# Patient Record
Sex: Female | Born: 1965 | Race: White | Hispanic: No
Health system: Southern US, Community
[De-identification: ages and names within clinical notes are randomized; demographics above are authoritative.]

---

## 2005-01-15 ENCOUNTER — Ambulatory Visit: Payer: Self-pay | Admitting: Pain Medicine

## 2013-06-30 ENCOUNTER — Other Ambulatory Visit: Payer: Self-pay | Admitting: Rehabilitation

## 2013-06-30 DIAGNOSIS — M545 Low back pain, unspecified: Secondary | ICD-10-CM

## 2013-07-21 ENCOUNTER — Inpatient Hospital Stay: Admission: RE | Admit: 2013-07-21 | Payer: Self-pay | Source: Ambulatory Visit

## 2013-12-02 ENCOUNTER — Other Ambulatory Visit: Payer: Self-pay | Admitting: Orthopaedic Surgery

## 2013-12-02 DIAGNOSIS — M545 Low back pain, unspecified: Secondary | ICD-10-CM

## 2013-12-14 ENCOUNTER — Ambulatory Visit
Admission: RE | Admit: 2013-12-14 | Discharge: 2013-12-14 | Disposition: A | Discharge status: Home / Self Care | Payer: Medicare Other | Source: Ambulatory Visit | Attending: Orthopaedic Surgery | Admitting: Orthopaedic Surgery

## 2013-12-14 DIAGNOSIS — M545 Low back pain, unspecified: Secondary | ICD-10-CM

## 2016-06-25 ENCOUNTER — Other Ambulatory Visit: Payer: Self-pay | Admitting: Pain Medicine

## 2016-06-29 ENCOUNTER — Other Ambulatory Visit: Payer: Self-pay | Admitting: Pain Medicine

## 2018-02-08 ENCOUNTER — Other Ambulatory Visit: Payer: Self-pay | Admitting: Family Medicine

## 2018-02-08 ENCOUNTER — Ambulatory Visit
Admission: RE | Admit: 2018-02-08 | Discharge: 2018-02-08 | Disposition: A | Discharge status: Home / Self Care | Payer: Medicare Other | Source: Ambulatory Visit | Attending: Family Medicine | Admitting: Family Medicine

## 2018-02-08 DIAGNOSIS — R0602 Shortness of breath: Secondary | ICD-10-CM

## 2018-02-08 DIAGNOSIS — S7011XA Contusion of right thigh, initial encounter: Secondary | ICD-10-CM

## 2018-02-08 DIAGNOSIS — M79662 Pain in left lower leg: Secondary | ICD-10-CM | POA: Insufficient documentation

## 2018-06-20 ENCOUNTER — Other Ambulatory Visit: Payer: Self-pay | Admitting: Physician Assistant
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
## 2020-01-13 ENCOUNTER — Other Ambulatory Visit: Payer: Self-pay | Admitting: Surgery

## 2020-01-13 DIAGNOSIS — M25562 Pain in left knee: Secondary | ICD-10-CM

## 2020-02-14 ENCOUNTER — Other Ambulatory Visit: Payer: Self-pay

## 2020-02-14 ENCOUNTER — Ambulatory Visit
Admission: RE | Admit: 2020-02-14 | Discharge: 2020-02-14 | Disposition: A | Discharge status: Home / Self Care | Payer: Medicare Other | Source: Ambulatory Visit | Attending: Surgery | Admitting: Surgery

## 2020-02-14 DIAGNOSIS — M25562 Pain in left knee: Secondary | ICD-10-CM

## 2021-08-06 IMAGING — MR MR KNEE*L* W/O CM
6 series · 40 of 40 positions shown · non-contrast
Comparison: Plain films of the knees 10/25/2012

CLINICAL DATA: History of prior left knee surgery x3, most recently
10 years ago. Posterior and lateral left knee pain and swelling.

EXAM:
MRI OF THE LEFT KNEE WITHOUT CONTRAST
TECHNIQUE: Multiplanar, multisequence MR imaging of the knee was performed. No
intravenous contrast was administered.

[Series 15: T2 fat-sat · axial · left · 4.0mm · 0.50mm/px · z∈[-95,+29]mm · 5 of 26 slices shown (1 of 3)]
[im 1/26]
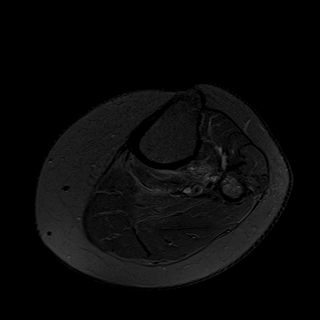
[im 7/26]
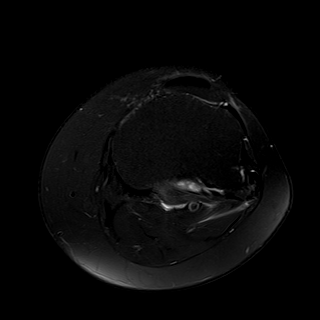
[im 13/26]
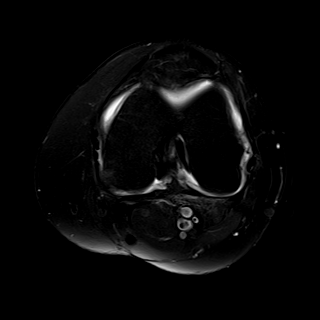
[im 19/26]
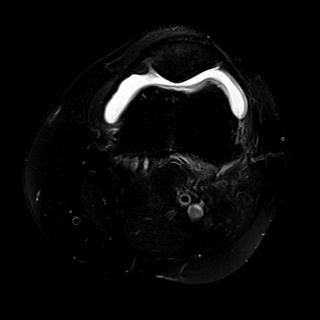
[im 26/26]
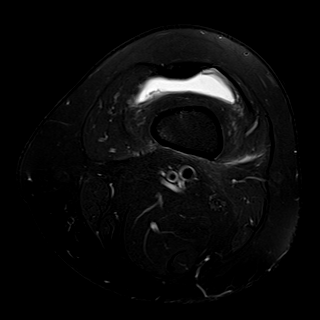

[Series 16: T1 · coronal · left · 4.0mm · 0.39mm/px · 7 of 32 slices shown]
[im 1/32]
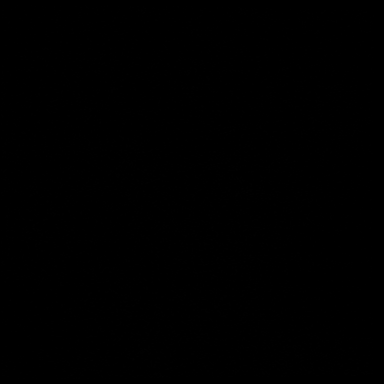
[im 6/32]
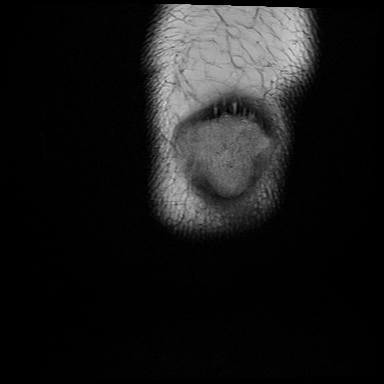
[im 11/32]
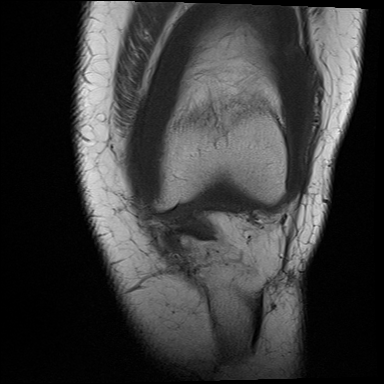
[im 16/32]
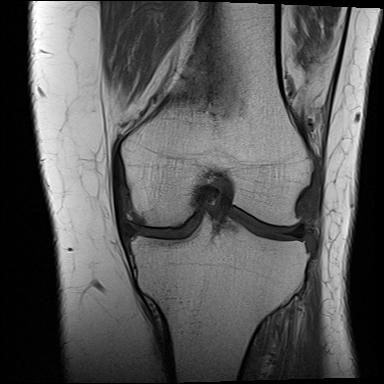
[im 21/32]
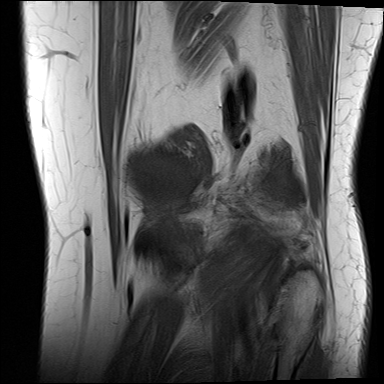
[im 26/32]
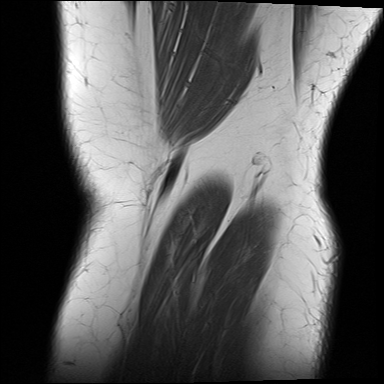
[im 32/32]
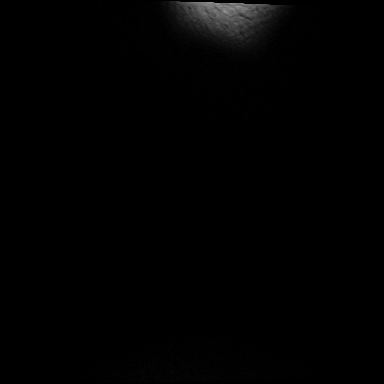

[Series 17: T2 fat-sat · coronal · left · 4.0mm · 0.59mm/px · 7 of 32 slices shown (2 of 3)]
[im 1/32]
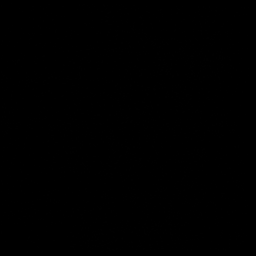
[im 6/32]
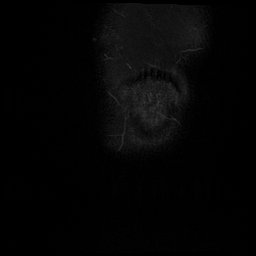
[im 11/32]
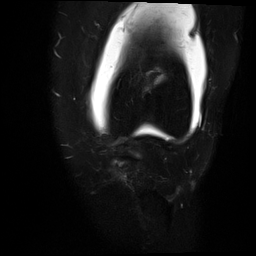
[im 16/32]
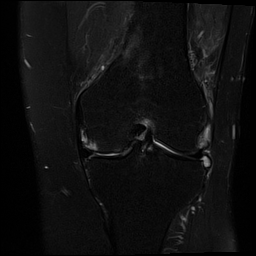
[im 21/32]
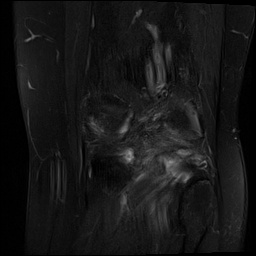
[im 26/32]
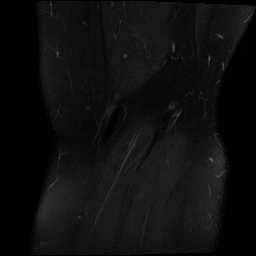
[im 32/32]
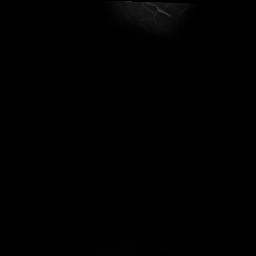

[Series 18: PD fat-sat · coronal · left · 4.0mm · 0.59mm/px · 7 of 32 slices shown (1 of 2)]
[im 1/32]
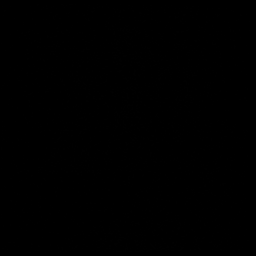
[im 6/32]
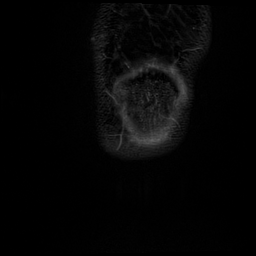
[im 11/32]
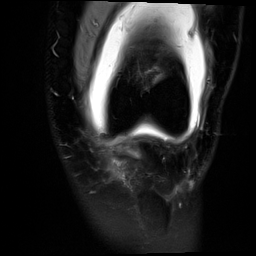
[im 16/32]
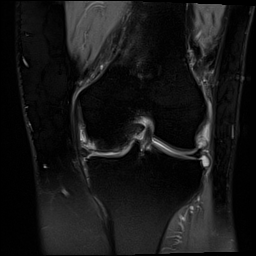
[im 21/32]
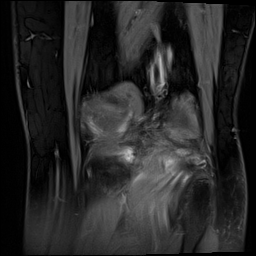
[im 26/32]
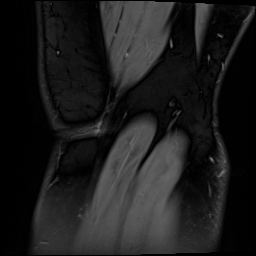
[im 32/32]
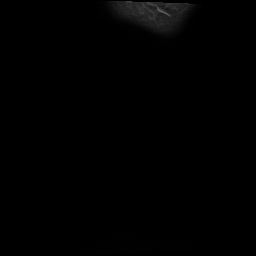

[Series 19: PD fat-sat · sagittal · left · 3.0mm · 0.59mm/px · 7 of 35 slices shown (2 of 2)]
[im 1/35]
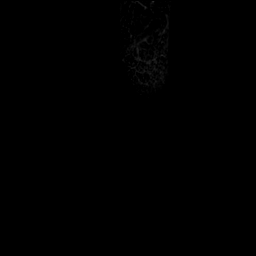
[im 6/35]
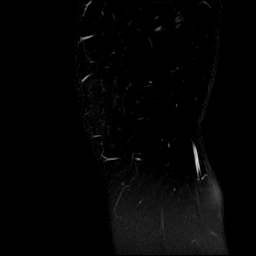
[im 12/35]
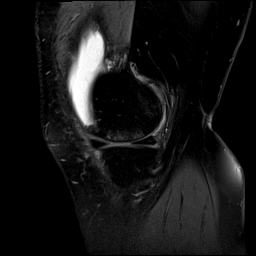
[im 18/35]
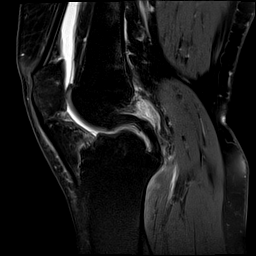
[im 23/35]
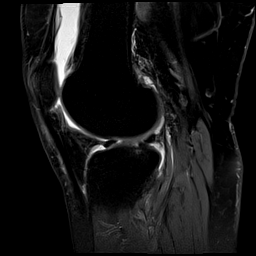
[im 29/35]
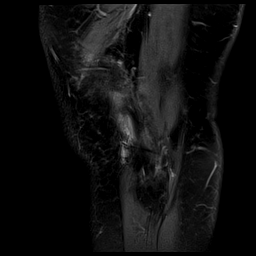
[im 35/35]
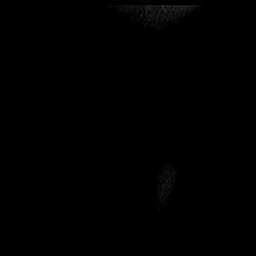

[Series 20: T2 fat-sat · sagittal · left · 3.0mm · 0.59mm/px · 7 of 36 slices shown (3 of 3)]
[im 1/36]
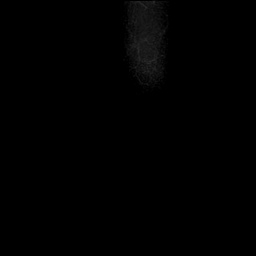
[im 6/36]
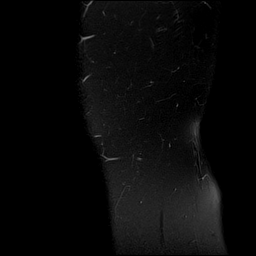
[im 12/36]
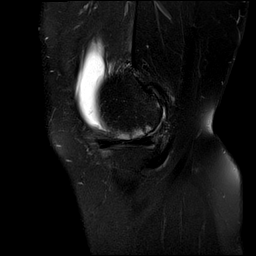
[im 18/36]
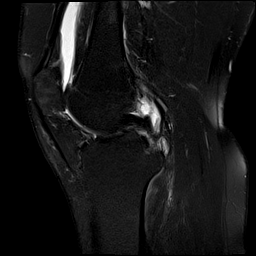
[im 24/36]
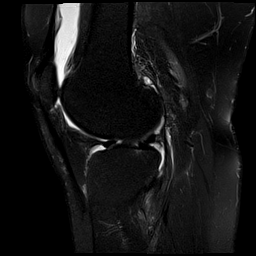
[im 30/36]
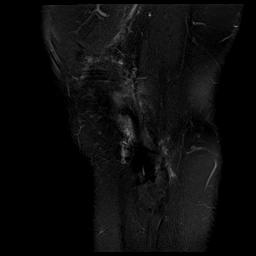
[im 36/36]
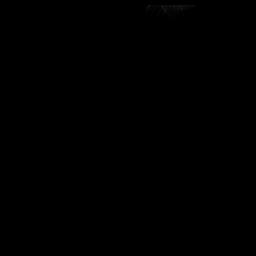

[40 of 40 positions shown; findings below may reference images not displayed]

FINDINGS: MENISCI

Medial meniscus: Intact. Degenerative intrasubstance signal in the
posterior horn and body noted.

Lateral meniscus:  Intact.

LIGAMENTS

Cruciates:  Intact.

Collaterals:  Intact.

CARTILAGE

Patellofemoral: Scattered cartilage thinning appears worst along the
inferior patella.

Medial: Degenerated and thinned with associated joint space
narrowing.

Lateral:  Mildly degenerated.

Joint:  Moderate to moderately large joint effusion.

Popliteal Fossa:  No Baker's cyst.

Extensor Mechanism:  Intact.

Bones: No fracture, stress change or worrisome lesion.
Tricompartmental osteophytosis is seen. Foci of subchondral edema
are seen in lateral femoral condyle and patella.

Other: None.
IMPRESSION: Advanced for age appearing osteoarthritis is worst in the medial
compartment.

Negative for meniscal or ligament tear.

## 2022-10-14 ENCOUNTER — Emergency Department
Admission: EM | Admit: 2022-10-14 | Discharge: 2022-10-14 | Disposition: A | Discharge status: Home / Self Care | Payer: Medicare HMO | Attending: Emergency Medicine | Admitting: Emergency Medicine

## 2022-10-14 ENCOUNTER — Other Ambulatory Visit: Payer: Self-pay

## 2022-10-14 DIAGNOSIS — W01198A Fall on same level from slipping, tripping and stumbling with subsequent striking against other object, initial encounter: Secondary | ICD-10-CM | POA: Insufficient documentation

## 2022-10-14 DIAGNOSIS — Y92481 Parking lot as the place of occurrence of the external cause: Secondary | ICD-10-CM | POA: Insufficient documentation

## 2022-10-14 DIAGNOSIS — S0081XA Abrasion of other part of head, initial encounter: Secondary | ICD-10-CM | POA: Diagnosis not present

## 2022-10-14 DIAGNOSIS — T07XXXA Unspecified multiple injuries, initial encounter: Secondary | ICD-10-CM

## 2022-10-14 DIAGNOSIS — S0993XA Unspecified injury of face, initial encounter: Secondary | ICD-10-CM | POA: Diagnosis present

## 2022-10-14 DIAGNOSIS — W19XXXA Unspecified fall, initial encounter: Secondary | ICD-10-CM
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
## 2022-10-14 NOTE — Discharge Instructions (Signed)
Patient medically cleared for pain mgmt

## 2023-03-26 ENCOUNTER — Other Ambulatory Visit: Payer: Self-pay | Admitting: Physician Assistant

## 2023-03-26 ENCOUNTER — Ambulatory Visit: Payer: Medicare HMO

## 2023-03-26 DIAGNOSIS — S300XXA Contusion of lower back and pelvis, initial encounter: Secondary | ICD-10-CM

## 2023-03-27 ENCOUNTER — Ambulatory Visit
Admission: RE | Admit: 2023-03-27 | Discharge: 2023-03-27 | Disposition: A | Discharge status: Home / Self Care | Payer: Medicare HMO | Source: Ambulatory Visit | Attending: Physician Assistant | Admitting: Physician Assistant

## 2023-03-27 DIAGNOSIS — S300XXA Contusion of lower back and pelvis, initial encounter: Secondary | ICD-10-CM | POA: Insufficient documentation

## 2023-03-27 DIAGNOSIS — M51379 Other intervertebral disc degeneration, lumbosacral region without mention of lumbar back pain or lower extremity pain: Secondary | ICD-10-CM | POA: Diagnosis not present

## 2023-03-27 DIAGNOSIS — M47816 Spondylosis without myelopathy or radiculopathy, lumbar region: Secondary | ICD-10-CM | POA: Insufficient documentation

## 2023-03-27 DIAGNOSIS — M48061 Spinal stenosis, lumbar region without neurogenic claudication: Secondary | ICD-10-CM | POA: Insufficient documentation

## 2023-03-27 DIAGNOSIS — X58XXXA Exposure to other specified factors, initial encounter: Secondary | ICD-10-CM | POA: Diagnosis not present

## 2023-03-27 DIAGNOSIS — I700 Atherosclerosis of aorta: Secondary | ICD-10-CM | POA: Insufficient documentation
